# Patient Record
Sex: Female | Born: 1935 | Race: Black or African American | Hispanic: No
Health system: Southern US, Community
[De-identification: ages and names within clinical notes are randomized; demographics above are authoritative.]

---

## 2019-04-12 ENCOUNTER — Encounter (HOSPITAL_COMMUNITY): Payer: Self-pay | Admitting: Emergency Medicine

## 2019-04-12 ENCOUNTER — Emergency Department (HOSPITAL_COMMUNITY): Payer: Medicare Other

## 2019-04-12 ENCOUNTER — Other Ambulatory Visit: Payer: Self-pay

## 2019-04-12 ENCOUNTER — Emergency Department (HOSPITAL_COMMUNITY)
Admission: EM | Admit: 2019-04-12 | Discharge: 2019-04-12 | Disposition: A | Discharge status: Home / Self Care | Payer: Medicare Other | Attending: Emergency Medicine | Admitting: Emergency Medicine

## 2019-04-12 DIAGNOSIS — R7309 Other abnormal glucose: Secondary | ICD-10-CM | POA: Diagnosis not present

## 2019-04-12 DIAGNOSIS — I160 Hypertensive urgency: Secondary | ICD-10-CM | POA: Diagnosis not present

## 2019-04-12 DIAGNOSIS — R4781 Slurred speech: Secondary | ICD-10-CM | POA: Diagnosis not present

## 2019-04-12 LAB — CBC
HCT: 49.2 % — ABNORMAL HIGH (ref 36.0–46.0)
Hemoglobin: 15.7 g/dL — ABNORMAL HIGH (ref 12.0–15.0)
MCH: 28.7 pg (ref 26.0–34.0)
MCHC: 31.9 g/dL (ref 30.0–36.0)
MCV: 89.9 fL (ref 80.0–100.0)
Platelets: 223 10*3/uL (ref 150–400)
RBC: 5.47 MIL/uL — ABNORMAL HIGH (ref 3.87–5.11)
RDW: 16.4 % — ABNORMAL HIGH (ref 11.5–15.5)
WBC: 4.1 10*3/uL (ref 4.0–10.5)
nRBC: 0 % (ref 0.0–0.2)

## 2019-04-12 LAB — COMPREHENSIVE METABOLIC PANEL
ALT: 12 U/L (ref 0–44)
AST: 19 U/L (ref 15–41)
Albumin: 3.3 g/dL — ABNORMAL LOW (ref 3.5–5.0)
Alkaline Phosphatase: 112 U/L (ref 38–126)
Anion gap: 14 (ref 5–15)
BUN: 21 mg/dL (ref 8–23)
CO2: 21 mmol/L — ABNORMAL LOW (ref 22–32)
Calcium: 10.5 mg/dL — ABNORMAL HIGH (ref 8.9–10.3)
Chloride: 106 mmol/L (ref 98–111)
Creatinine, Ser: 1.57 mg/dL — ABNORMAL HIGH (ref 0.44–1.00)
GFR calc Af Amer: 35 mL/min — ABNORMAL LOW (ref >60)
GFR calc non Af Amer: 30 mL/min — ABNORMAL LOW (ref >60)
Glucose, Bld: 155 mg/dL — ABNORMAL HIGH (ref 70–99)
Potassium: 3.8 mmol/L (ref 3.5–5.1)
Sodium: 141 mmol/L (ref 135–145)
Total Bilirubin: 0.7 mg/dL (ref 0.3–1.2)
Total Protein: 8.8 g/dL — ABNORMAL HIGH (ref 6.5–8.1)

## 2019-04-12 LAB — I-STAT CHEM 8, ED
BUN: 25 mg/dL — ABNORMAL HIGH (ref 8–23)
Calcium, Ion: 1.19 mmol/L (ref 1.15–1.40)
Chloride: 107 mmol/L (ref 98–111)
Creatinine, Ser: 1.5 mg/dL — ABNORMAL HIGH (ref 0.44–1.00)
Glucose, Bld: 156 mg/dL — ABNORMAL HIGH (ref 70–99)
HCT: 51 % — ABNORMAL HIGH (ref 36.0–46.0)
Hemoglobin: 17.3 g/dL — ABNORMAL HIGH (ref 12.0–15.0)
Potassium: 3.9 mmol/L (ref 3.5–5.1)
Sodium: 142 mmol/L (ref 135–145)
TCO2: 26 mmol/L (ref 22–32)

## 2019-04-12 LAB — DIFFERENTIAL
Abs Immature Granulocytes: 0.01 10*3/uL (ref 0.00–0.07)
Basophils Absolute: 0 10*3/uL (ref 0.0–0.1)
Basophils Relative: 1 %
Eosinophils Absolute: 0.2 10*3/uL (ref 0.0–0.5)
Eosinophils Relative: 4 %
Immature Granulocytes: 0 %
Lymphocytes Relative: 31 %
Lymphs Abs: 1.3 10*3/uL (ref 0.7–4.0)
Monocytes Absolute: 0.3 10*3/uL (ref 0.1–1.0)
Monocytes Relative: 7 %
Neutro Abs: 2.4 10*3/uL (ref 1.7–7.7)
Neutrophils Relative %: 57 %

## 2019-04-12 LAB — PROTIME-INR
INR: 1.1 (ref 0.8–1.2)
Prothrombin Time: 13.6 seconds (ref 11.4–15.2)

## 2019-04-12 LAB — CBG MONITORING, ED: Glucose-Capillary: 154 mg/dL — ABNORMAL HIGH (ref 70–99)

## 2019-04-12 LAB — APTT: aPTT: 30 seconds (ref 24–36)

## 2019-04-12 MED ORDER — IOHEXOL 350 MG/ML SOLN
100.0000 mL | Freq: Once | INTRAVENOUS | Status: AC | PRN
  Administered 2019-04-12: 100 mL via INTRAVENOUS

## 2019-04-12 MED ORDER — SODIUM CHLORIDE 0.9% FLUSH
3.0000 mL | Freq: Once | INTRAVENOUS | Status: DC
Start: 2019-04-12 — End: 2019-04-12

## 2019-04-12 NOTE — Consult Note (Signed)
Neurology Consultation  Reason for Consult: code stroke, confusion and speech changes Referring Physician: Dr Charm Barges  CC: Sudden onset of confusion and speech changes.  History is obtained from: Family over the phone  HPI: Natasha Salazar is a 86 y.o. female past medical history of hypertension, diabetes, atrial fibrillation, prior history of breast cancer, and moderate dementia, visiting here for a family member's funeral from [place]. Patient was in her usual state of health till about 10:00 when she reached to the funeral site for her younger sister who had died a few days ago.  According to the family, the patient has dementia and was receiving sympathy cards for her sister's demise but was not really processing the information and understanding that the sister died until she came to the viewing, starting the cascade and suddenly became disoriented and confabulated. Her speech made no sense.  She appeared very different than her usual self.  EMS was called and they found her to be slightly dysphasic-   LKW: 1000 today tpa given?: no, Xarelto Premorbid modified Rankin scale (mRS):3  ZOX:[id number] to obtain due to altered mental status.   No past medical history on file. HTN HLD DM Breast Ca Dementia  No family history on file. No h/o stroke  Social History:   has no history on file for tobacco, alcohol, and drug.  Medications  Current Facility-Administered Medications:  .  sodium chloride flush (NS) 0.9 % injection 3 mL, 3 mL, Intravenous, Once, Terrilee Files, MD  Current Outpatient Medications:  .  allopurinol (ZYLOPRIM) 300 MG tablet, Take 150 mg by mouth at bedtime., Disp: , Rfl:  .  amLODipine (NORVASC) 10 MG tablet, Take 10 mg by mouth daily., Disp: , Rfl:  .  ascorbic acid (VITAMIN C) 500 MG tablet, Take 500 mg by mouth daily., Disp: , Rfl:  .  aspirin 81 MG EC tablet, Take 1 tablet by mouth daily., Disp: , Rfl:  .  atorvastatin (LIPITOR) 40 MG tablet, Take 40 mg by  mouth daily., Disp: , Rfl:  .  donepezil (ARICEPT) 5 MG tablet, Take 5 mg by mouth at bedtime., Disp: , Rfl:  .  furosemide (LASIX) 40 MG tablet, Take 40 mg by mouth daily as needed for edema., Disp: , Rfl:  .  isosorbide mononitrate (IMDUR) 60 MG 24 hr tablet, Take 60 mg by mouth every morning., Disp: , Rfl:  .  losartan (COZAAR) 100 MG tablet, Take 100 mg by mouth daily., Disp: , Rfl:  .  metoprolol tartrate (LOPRESSOR) 25 MG tablet, Take 12.5 mg by mouth in the morning and at bedtime., Disp: , Rfl:  .  omeprazole (PRILOSEC) 40 MG capsule, Take 1 capsule by mouth at bedtime., Disp: , Rfl:  .  POLY-IRON 150 150 MG capsule, Take 150 mg by mouth daily., Disp: , Rfl:  .  TRADJENTA 5 MG TABS tablet, Take 5 mg by mouth daily., Disp: , Rfl:  .  XARELTO 15 MG TABS tablet, Take 15 mg by mouth at bedtime., Disp: , Rfl:   Exam: Current vital signs: BP (!) 164/84   Pulse 76   Temp (!) 97.4 F (36.3 C) (Oral)   Resp 14   Wt 93.7 kg   SpO2 98%  Vital signs in last 24 hours: Temp:  [97.4 F (36.3 C)] 97.4 F (36.3 C) (02/27 1227) Pulse Rate:  [71-76] 76 (02/27 1245) Resp:  [14-16] 14 (02/27 1400) BP: (140-164)/(84-106) 164/84 (02/27 1400) SpO2:  [94 %-99 %] 98 % (02/27  1245) Weight:  [93.7 kg] 93.7 kg (02/27 1221) General: Awake alert in no distress HEENT: Normocephalic atraumatic CVs: Irregularly irregular Respiratory: Breathing normally saturating well Neurological exam She is awake, alert, appears a little anxious and is staring around with somewhat of a confused look.  During the course of the encounter, she is more verbal and starts to respond more appropriately. Upon asking age said she is 86 years old.  Could not tell me the month. Naming, comprehension and repetition is intact albeit slow. Poor attention concentration She has mild to moderate dysarthria. Cranial: Pupils equal round react light, extraocular movements intact, visual fields are full, face appears symmetric, tongue and  palate midline. Motor exam: Antigravity 5/5 on both upper extremities.  Both lower extremities are symmetrically weak and are barely antigravity-some of it is effort dependent exam. Sensory exam: Intact to touch all over Coordination: No dysmetria on finger-nose-finger testing NIH stroke scale 1a Level of Conscious.: 0 1b LOC Questions: 2 1c LOC Commands: 0 2 Best Gaze: 0 3 Visual: 0 4 Facial Palsy: 0 5a Motor Arm - left: 0 5b Motor Arm - Right: 0 6a Motor Leg - Left: 2 6b Motor Leg - Right: 2 7 Limb Ataxia: 0 8 Sensory: 0 9 Best Language: 0 10 Dysarthria: 1 11 Extinct. and Inatten.: 0 TOTAL: 7   Labs I have reviewed labs in epic and the results pertinent to this consultation are:  CBC    Component Value Date/Time   WBC 4.1 04/12/2019 1153   RBC 5.47 (H) 04/12/2019 1153   HGB 15.7 (H) 04/12/2019 1153   HCT 49.2 (H) 04/12/2019 1153   PLT 223 04/12/2019 1153   MCV 89.9 04/12/2019 1153   MCH 28.7 04/12/2019 1153   MCHC 31.9 04/12/2019 1153   RDW 16.4 (H) 04/12/2019 1153   LYMPHSABS 1.3 04/12/2019 1153   MONOABS 0.3 04/12/2019 1153   EOSABS 0.2 04/12/2019 1153   BASOSABS 0.0 04/12/2019 1153    CMP     Component Value Date/Time   NA 141 04/12/2019 1153   K 3.8 04/12/2019 1153   CL 106 04/12/2019 1153   CO2 21 (L) 04/12/2019 1153   GLUCOSE 155 (H) 04/12/2019 1153   BUN 21 04/12/2019 1153   CREATININE 1.57 (H) 04/12/2019 1153   CALCIUM 10.5 (H) 04/12/2019 1153   PROT 8.8 (H) 04/12/2019 1153   ALBUMIN 3.3 (L) 04/12/2019 1153   AST 19 04/12/2019 1153   ALT 12 04/12/2019 1153   ALKPHOS 112 04/12/2019 1153   BILITOT 0.7 04/12/2019 1153   GFRNONAA 30 (L) 04/12/2019 1153   GFRAA 35 (L) 04/12/2019 1153    Imaging I have reviewed the images obtained:  CT-scan of the brain-severe chronic white matter disease.  Moderate atrophy.  No acute changes. CTA head and neck with no LVO.  Multiple intracerebral calcifications in anterior and posterior circulation.  CT  perfusion study unremarkable  Assessment: 86 year old past history of hypertension diabetes atrial fibrillation dementia presenting for evaluation of acute onset of confusion and speech difficulty when she was attending a funeral of her younger sibling. According to family, she had a very animated stress reaction followed by garbled speech.  Also noted to be hypersensitive in the systolic [id number]. Imaging unremarkable for stroke process. Given the history of being on Xarelto for atrial fibrillation, stat head CT was done to rule out a bleed-CT head was negative. Symptoms likely consistent with hypertensive emergency versus acute stress reaction.  Recommendations: Supportive treatment per the emergency room Blood  pressure-gradual reduction by 20% with ultimate goal of blood pressure less than 140/90 Continue home anticoagulation and antihypertensives. Management of dementia per outpatient neurology locally whenever she gets her care. Neurology will be available as needed -- Milon Dikes, MD [facility] Pager: [phone] If 7pm to 7am, please call on call as listed on [facility].  CRITICAL CARE ATTESTATION Performed by: Milon Dikes, MD Total critical care time: 40 minutes Critical care time was exclusive of separately billable procedures and treating other patients and/or supervising APPs/Residents/Students Critical care was necessary to treat or prevent imminent or life-threatening deterioration due to stroke like symptoms, hypertensive urgency  This patient is critically ill and at significant risk for neurological worsening and/or death and care requires constant monitoring. Critical care was time spent personally by me on the following activities: development of treatment plan with patient and/or surrogate as well as nursing, discussions with consultants, evaluation of patient's response to treatment, examination of patient, obtaining history from patient or surrogate, ordering and  performing treatments and interventions, ordering and review of laboratory studies, ordering and review of radiographic studies, pulse oximetry, re-evaluation of patient's condition, participation in multidisciplinary rounds and medical decision making of high complexity in the care of this patient.

## 2019-04-12 NOTE — ED Triage Notes (Signed)
Pt arrives [facility] as a code stroke. Pt was at her sisters viewing when family noticed a sudden change in pt speech. Pt with hx of afib and dementia. + dysphagia per ems. 18g LAC.  HR 70 BP 202/108 94% RA

## 2019-04-12 NOTE — Discharge Instructions (Addendum)
You were seen in the emergency department for an episode of garbled speech and elevated blood pressure.  You had blood work and CAT scans that did not show any obvious sign of stroke.  Your blood pressure improved with time here.  This is possibly related to a very stressful day.  Please continue your regular medications and follow-up with your doctor.  Return to the emergency department if any worsening or concerning symptoms.

## 2019-04-12 NOTE — ED Provider Notes (Signed)
[facility] EMERGENCY DEPARTMENT Provider Note   CSN: [id number] Arrival date & time: 04/12/19  1145     History Chief Complaint  Patient presents with  . Code Stroke    Natasha Salazar is a 86 y.o. female.  She is brought in by EMS for acute onset of garbled speech.  She was at her deceased sisters viewing when she acutely had a change in her behavior and some garbled speech.  No reported numbness or weakness.  Code stroke activation in the field.  Hypertensive here.  Reportedly on blood thinners.  The history is provided by the patient and the EMS personnel.  Cerebrovascular Accident This is a new problem. The current episode started less than 1 hour ago. The problem has not changed since onset.Associated symptoms include headaches. Pertinent negatives include no chest pain, no abdominal pain and no shortness of breath. Nothing aggravates the symptoms. Nothing relieves the symptoms. She has tried nothing for the symptoms. The treatment provided no relief.       No past medical history on file.  There are no problems to display for this patient.   History reviewed. No pertinent surgical history.   OB History   No obstetric history on file.     No family history on file.  Social History   Tobacco Use  . Smoking status: Not on file  Substance Use Topics  . Alcohol use: Not on file  . Drug use: Not on file    Home Medications Prior to Admission medications   Not on File    Allergies    Patient has no allergy information on record.  Review of Systems   Review of Systems  Constitutional: Negative for fever.  HENT: Negative for sore throat.   Eyes: Negative for visual disturbance.  Respiratory: Negative for shortness of breath.   Cardiovascular: Negative for chest pain.  Gastrointestinal: Negative for abdominal pain.  Genitourinary: Negative for dysuria.  Musculoskeletal: Negative for neck pain.  Skin: Negative for rash.  Neurological: Positive  for headaches.    Physical Exam Updated Vital Signs BP (!) 164/84   Pulse 76   Temp (!) 97.4 F (36.3 C) (Oral)   Resp 14   Wt 93.7 kg   SpO2 98%   Physical Exam Vitals and nursing note reviewed.  Constitutional:      General: She is not in acute distress.    Appearance: She is well-developed.  HENT:     Head: Normocephalic and atraumatic.  Eyes:     Conjunctiva/sclera: Conjunctivae normal.  Cardiovascular:     Rate and Rhythm: Normal rate and regular rhythm.     Heart sounds: No murmur.  Pulmonary:     Effort: Pulmonary effort is normal. No respiratory distress.     Breath sounds: Normal breath sounds.  Abdominal:     Palpations: Abdomen is soft.     Tenderness: There is no abdominal tenderness.  Musculoskeletal:        General: No deformity or signs of injury. Normal range of motion.     Cervical back: Neck supple.  Skin:    General: Skin is warm and dry.     Capillary Refill: Capillary refill takes less than 2 seconds.  Neurological:     General: No focal deficit present.     Mental Status: She is alert. Mental status is at baseline.     ED Results / Procedures / Treatments   Labs (all labs ordered are listed, but only abnormal  results are displayed) Labs Reviewed  CBC - Abnormal; Notable for the following components:      Result Value   RBC 5.47 (*)    Hemoglobin 15.7 (*)    HCT 49.2 (*)    RDW 16.4 (*)    All other components within normal limits  COMPREHENSIVE METABOLIC PANEL - Abnormal; Notable for the following components:   CO2 21 (*)    Glucose, Bld 155 (*)    Creatinine, Ser 1.57 (*)    Calcium 10.5 (*)    Total Protein 8.8 (*)    Albumin 3.3 (*)    GFR calc non Af Amer 30 (*)    GFR calc Af Amer 35 (*)    All other components within normal limits  I-STAT CHEM 8, ED - Abnormal; Notable for the following components:   BUN 25 (*)    Creatinine, Ser 1.50 (*)    Glucose, Bld 156 (*)    Hemoglobin 17.3 (*)    HCT 51.0 (*)    All other  components within normal limits  CBG MONITORING, ED - Abnormal; Notable for the following components:   Glucose-Capillary 154 (*)    All other components within normal limits  PROTIME-INR  APTT  DIFFERENTIAL    EKG None  Radiology CT Code Stroke CTA Head W/WO contrast  Result Date: 04/12/2019 CLINICAL DATA:  Aphasia EXAM: CT ANGIOGRAPHY HEAD AND NECK CT PERFUSION BRAIN TECHNIQUE: Multidetector CT imaging of the head and neck was performed using the standard protocol during bolus administration of intravenous contrast. Multiplanar CT image reconstructions and MIPs were obtained to evaluate the vascular anatomy. Carotid stenosis measurements (when applicable) are obtained utilizing NASCET criteria, using the distal internal carotid diameter as the denominator. Multiphase CT imaging of the brain was performed following IV bolus contrast injection. Subsequent parametric perfusion maps were calculated using RAPID software. CONTRAST:  OMNIPAQUE IOHEXOL 350 MG/ML SOLN COMPARISON:  CT head earlier today FINDINGS: CTA NECK FINDINGS Aortic arch: Standard branching. Imaged portion shows no evidence of aneurysm or dissection. No significant stenosis of the major arch vessel origins. Atherosclerotic calcification aortic arch and proximal great vessels. Right carotid system: Atherosclerotic calcification right carotid bifurcation without significant stenosis. Tortuosity right internal carotid artery. Left carotid system: Atherosclerotic calcification left carotid bifurcation. Less than 25% diameter stenosis proximal left internal carotid artery which is tortuous. Vertebral arteries: Left vertebral artery dominant. Mild stenosis proximal and distal left vertebral artery. Small right vertebral artery is patent to the basilar without significant stenosis. Skeleton: Cervical spondylosis.  No acute skeletal abnormality. Other neck: Diffuse enlargement of the thyroid compatible with goiter. Multiple small nodules.  No dominant mass. Trachea deviated to the left. Upper chest: Lung apices clear bilaterally. Review of the MIP images confirms the above findings CTA HEAD FINDINGS Anterior circulation: Extensive atherosclerotic calcification in the cavernous carotid bilaterally with mild to moderate stenosis bilaterally. Anterior and middle cerebral arteries patent bilaterally without stenosis or large vessel occlusion. Posterior circulation: Mild calcific stenosis distal left vertebral artery. PICA patent bilaterally. Basilar widely patent. AICA, superior cerebellar, posterior cerebral arteries patent bilaterally. Fetal origin posterior cerebral artery bilaterally. Venous sinuses: Minimal venous contrast due to arterial phase scanning Anatomic variants: None Review of the MIP images confirms the above findings CT Brain Perfusion Findings: ASPECTS: 10 CBF (<30%) Volume: 43mL Perfusion (Tmax>6.0s) volume: 55mL Mismatch Volume: 32mL Infarction Location:None IMPRESSION: 1. CT perfusion negative for acute infarct or ischemia 2. Negative for intracranial large vessel occlusion 3. Atherosclerotic disease of the  carotid bifurcation and in the cavernous carotid bilaterally without flow limiting stenosis. 4. Left vertebral dominant with mild stenosis proximally and distally. Small right vertebral artery without significant stenosis. 5. Diffuse thyroid enlargement with goiter change. Electronically Signed   By: Franchot Gallo M.D.   On: 04/12/2019 12:31   CT Code Stroke CTA Neck W/WO contrast  Result Date: 04/12/2019 CLINICAL DATA:  Aphasia EXAM: CT ANGIOGRAPHY HEAD AND NECK CT PERFUSION BRAIN TECHNIQUE: Multidetector CT imaging of the head and neck was performed using the standard protocol during bolus administration of intravenous contrast. Multiplanar CT image reconstructions and MIPs were obtained to evaluate the vascular anatomy. Carotid stenosis measurements (when applicable) are obtained utilizing NASCET criteria, using the distal  internal carotid diameter as the denominator. Multiphase CT imaging of the brain was performed following IV bolus contrast injection. Subsequent parametric perfusion maps were calculated using RAPID software. CONTRAST:  117mL OMNIPAQUE IOHEXOL 350 MG/ML SOLN COMPARISON:  CT head earlier today FINDINGS: CTA NECK FINDINGS Aortic arch: Standard branching. Imaged portion shows no evidence of aneurysm or dissection. No significant stenosis of the major arch vessel origins. Atherosclerotic calcification aortic arch and proximal great vessels. Right carotid system: Atherosclerotic calcification right carotid bifurcation without significant stenosis. Tortuosity right internal carotid artery. Left carotid system: Atherosclerotic calcification left carotid bifurcation. Less than 25% diameter stenosis proximal left internal carotid artery which is tortuous. Vertebral arteries: Left vertebral artery dominant. Mild stenosis proximal and distal left vertebral artery. Small right vertebral artery is patent to the basilar without significant stenosis. Skeleton: Cervical spondylosis.  No acute skeletal abnormality. Other neck: Diffuse enlargement of the thyroid compatible with goiter. Multiple small nodules. No dominant mass. Trachea deviated to the left. Upper chest: Lung apices clear bilaterally. Review of the MIP images confirms the above findings CTA HEAD FINDINGS Anterior circulation: Extensive atherosclerotic calcification in the cavernous carotid bilaterally with mild to moderate stenosis bilaterally. Anterior and middle cerebral arteries patent bilaterally without stenosis or large vessel occlusion. Posterior circulation: Mild calcific stenosis distal left vertebral artery. PICA patent bilaterally. Basilar widely patent. AICA, superior cerebellar, posterior cerebral arteries patent bilaterally. Fetal origin posterior cerebral artery bilaterally. Venous sinuses: Minimal venous contrast due to arterial phase scanning Anatomic  variants: None Review of the MIP images confirms the above findings CT Brain Perfusion Findings: ASPECTS: 10 CBF (<30%) Volume: 59mL Perfusion (Tmax>6.0s) volume: 24mL Mismatch Volume: 53mL Infarction Location:None IMPRESSION: 1. CT perfusion negative for acute infarct or ischemia 2. Negative for intracranial large vessel occlusion 3. Atherosclerotic disease of the carotid bifurcation and in the cavernous carotid bilaterally without flow limiting stenosis. 4. Left vertebral dominant with mild stenosis proximally and distally. Small right vertebral artery without significant stenosis. 5. Diffuse thyroid enlargement with goiter change. Electronically Signed   By: Franchot Gallo M.D.   On: 04/12/2019 12:31   CT Code Stroke Cerebral Perfusion with contrast  Result Date: 04/12/2019 CLINICAL DATA:  Aphasia EXAM: CT ANGIOGRAPHY HEAD AND NECK CT PERFUSION BRAIN TECHNIQUE: Multidetector CT imaging of the head and neck was performed using the standard protocol during bolus administration of intravenous contrast. Multiplanar CT image reconstructions and MIPs were obtained to evaluate the vascular anatomy. Carotid stenosis measurements (when applicable) are obtained utilizing NASCET criteria, using the distal internal carotid diameter as the denominator. Multiphase CT imaging of the brain was performed following IV bolus contrast injection. Subsequent parametric perfusion maps were calculated using RAPID software. CONTRAST:  130mL OMNIPAQUE IOHEXOL 350 MG/ML SOLN COMPARISON:  CT head earlier today FINDINGS: CTA NECK  FINDINGS Aortic arch: Standard branching. Imaged portion shows no evidence of aneurysm or dissection. No significant stenosis of the major arch vessel origins. Atherosclerotic calcification aortic arch and proximal great vessels. Right carotid system: Atherosclerotic calcification right carotid bifurcation without significant stenosis. Tortuosity right internal carotid artery. Left carotid system: Atherosclerotic  calcification left carotid bifurcation. Less than 25% diameter stenosis proximal left internal carotid artery which is tortuous. Vertebral arteries: Left vertebral artery dominant. Mild stenosis proximal and distal left vertebral artery. Small right vertebral artery is patent to the basilar without significant stenosis. Skeleton: Cervical spondylosis.  No acute skeletal abnormality. Other neck: Diffuse enlargement of the thyroid compatible with goiter. Multiple small nodules. No dominant mass. Trachea deviated to the left. Upper chest: Lung apices clear bilaterally. Review of the MIP images confirms the above findings CTA HEAD FINDINGS Anterior circulation: Extensive atherosclerotic calcification in the cavernous carotid bilaterally with mild to moderate stenosis bilaterally. Anterior and middle cerebral arteries patent bilaterally without stenosis or large vessel occlusion. Posterior circulation: Mild calcific stenosis distal left vertebral artery. PICA patent bilaterally. Basilar widely patent. AICA, superior cerebellar, posterior cerebral arteries patent bilaterally. Fetal origin posterior cerebral artery bilaterally. Venous sinuses: Minimal venous contrast due to arterial phase scanning Anatomic variants: None Review of the MIP images confirms the above findings CT Brain Perfusion Findings: ASPECTS: 10 CBF (<30%) Volume: 71mL Perfusion (Tmax>6.0s) volume: 79mL Mismatch Volume: 3mL Infarction Location:None IMPRESSION: 1. CT perfusion negative for acute infarct or ischemia 2. Negative for intracranial large vessel occlusion 3. Atherosclerotic disease of the carotid bifurcation and in the cavernous carotid bilaterally without flow limiting stenosis. 4. Left vertebral dominant with mild stenosis proximally and distally. Small right vertebral artery without significant stenosis. 5. Diffuse thyroid enlargement with goiter change. Electronically Signed   By: Marlan Palau M.D.   On: 04/12/2019 12:31   CT HEAD CODE  STROKE WO CONTRAST  Result Date: 04/12/2019 CLINICAL DATA:  Code stroke.  Acute neuro deficit.  Rule out stroke EXAM: CT HEAD WITHOUT CONTRAST TECHNIQUE: Contiguous axial images were obtained from the base of the skull through the vertex without intravenous contrast. COMPARISON:  None. FINDINGS: Brain: Moderate atrophy. Extensive chronic microvascular ischemic changes throughout the white matter. Chronic infarct left basal ganglia. Small chronic infarcts in the cerebellum bilaterally. Negative for acute infarct, hemorrhage, mass. Vascular: Extensive atherosclerotic calcification carotid and vertebral arteries. Negative for hyperdense vessel Skull: Negative Sinuses/Orbits: Mild mucosal edema paranasal sinuses. Bilateral cataract extraction. Other: None ASPECTS (Alberta Stroke Program Early CT Score) - Ganglionic level infarction (caudate, lentiform nuclei, internal capsule, insula, M1-M3 cortex): 7 - Supraganglionic infarction (M4-M6 cortex): 3 Total score (0-10 with 10 being normal): 10 IMPRESSION: 1. No acute abnormality 2. Advanced atrophy and extensive chronic ischemic changes. 3. ASPECTS is 10 4. These results were called by telephone at the time of interpretation on 04/12/2019 at 12:06 pm to provider Wilford Corner , who verbally acknowledged these results. Electronically Signed   By: Marlan Palau M.D.   On: 04/12/2019 12:06    Procedures Procedures (including critical care time)  Medications Ordered in ED Medications  sodium chloride flush (NS) 0.9 % injection 3 mL (has no administration in time range)  iohexol (OMNIPAQUE) 350 MG/ML injection 100 mL (100 mLs Intravenous Contrast Given 04/12/19 1220)    ED Course  I have reviewed the triage vital signs and the nursing notes.  Pertinent labs & imaging results that were available during my care of the patient were reviewed by me and considered in my medical decision  making (see chart for details).  Clinical Course as of Apr 11 1638  Sat Apr 12, 2019   1208 Differential includes stroke, intracerebral bleed, hypertensive emergency, grief reaction   [MB]  1210 Initial head CT negative for bleed.   [MB]  1230 Patient's daughter here is now able to give a little more history.  They are visiting from [place] and patient's sister had passed away about a week ago.  Today there was a viewing which caused the patient to start crying and have trouble breathing.  Patient is back at her baseline.  Has dementia and poor short-term memory.  Patient denies any complaints currently.  She has no recollection of the event that happened earlier.   [MB]  1243 CT imaging showing microvascular changes but no acute bleed or infarct.  Neurology feels this may be more behavioral than structural.   [MB]  1255 EKG showing A. fib rate of 91 PVC nonspecific ST-T's.  No old 1 to compare with.   [MB]  1302 Daughter states patient has not taken her blood pressure medicine this morning.  Likely why her BP is high. Will let her take her regular medications.    [MB]  1418 Patient took her home meds and her blood pressure is somewhat improved.  I do not have any old labs to compare with but she has some elevation of her creatinine to 1.57.  Rest of her labs look fairly benign.   [MB]  1418 Daughter states she is at her baseline and is comfortable taking her home.   [MB]    Clinical Course User Index [MB] Terrilee Files, MD   MDM Rules/Calculators/A&P                       Final Clinical Impression(s) / ED Diagnoses Final diagnoses:  Slurred speech  Hypertensive urgency    Rx / DC Orders ED Discharge Orders    None       Terrilee Files, MD 04/12/19 [phone]

## 2019-04-12 NOTE — ED Notes (Signed)
Per MD Charm Barges, pt taking home BP medications.

## 2019-04-12 NOTE — ED Notes (Signed)
Called [facility] to cancel code stroke  

## 2019-04-12 NOTE — ED Notes (Signed)
Patient Alert and oriented to baseline. Stable and ambulatory to baseline. Patient verbalized understanding of the discharge instructions.  Patient belongings were taken by the patient.   

## 2020-08-13 DEATH — deceased

## 2021-01-29 IMAGING — CT CT HEAD CODE STROKE
3 series · 15 of 47 positions shown, 18 images · non-contrast
Comparison: None.

CLINICAL DATA: Code stroke.  Acute neuro deficit.  Rule out stroke

EXAM:
CT HEAD WITHOUT CONTRAST
TECHNIQUE: Contiguous axial images were obtained from the base of the skull
through the vertex without intravenous contrast.

[Series 3: head 5.0 st · axial · 0.44mm/px · z∈[-146,-6]mm · 9 of 34 slices shown, 12 images]
[im 3/34  brain]
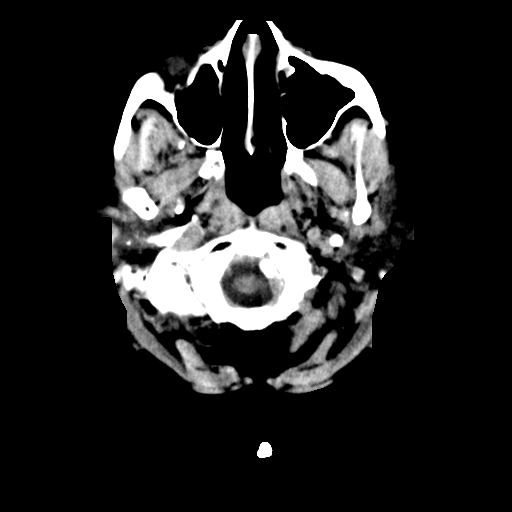
[im 3/34  bone]
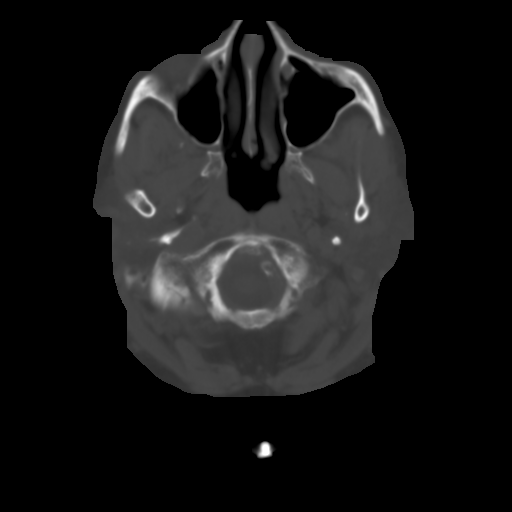
[im 6/34  brain]
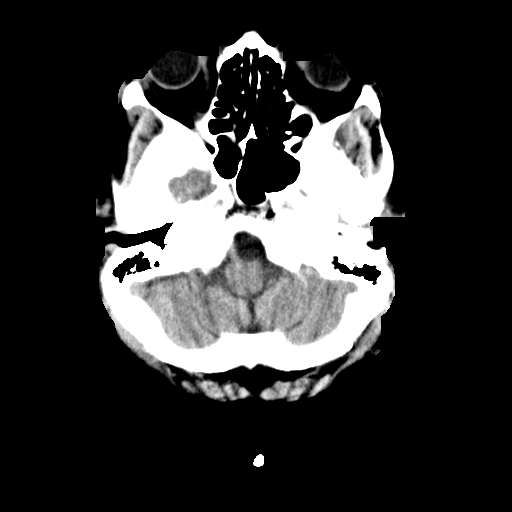
[im 10/34  brain]
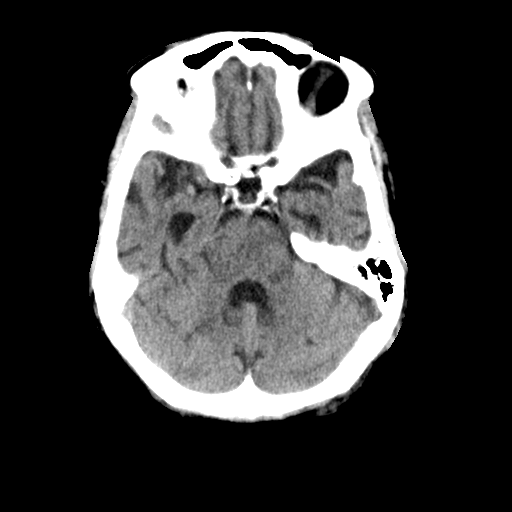
[im 13/34  brain]
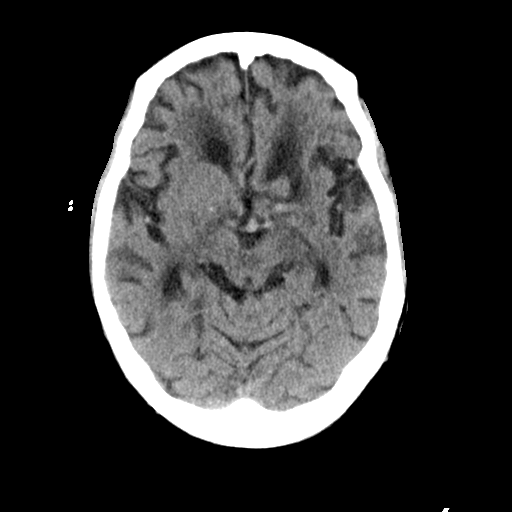
[im 18/34  brain]
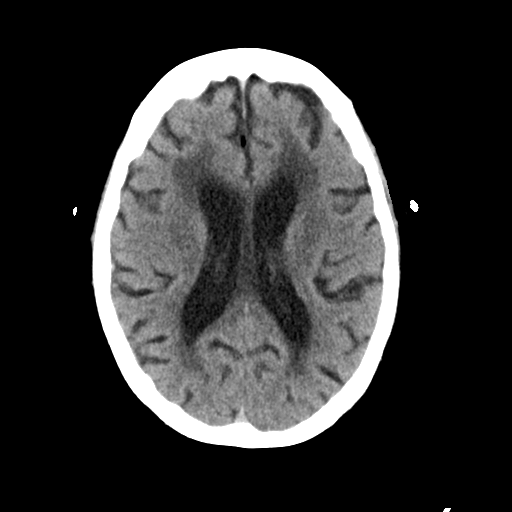
[im 18/34  bone]
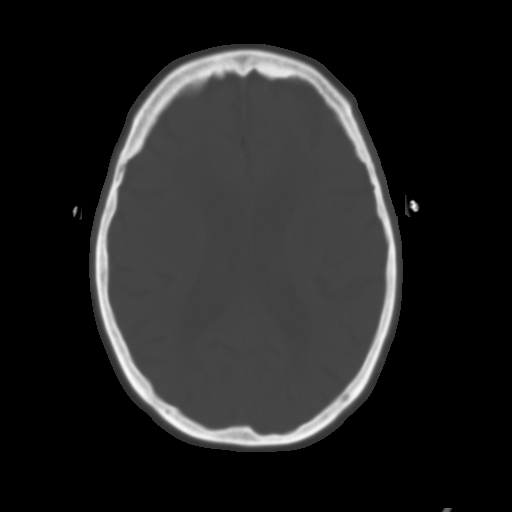
[im 21/34  brain]
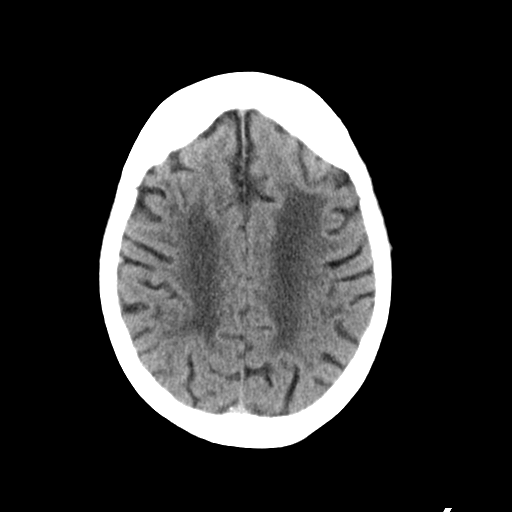
[im 24/34  brain]
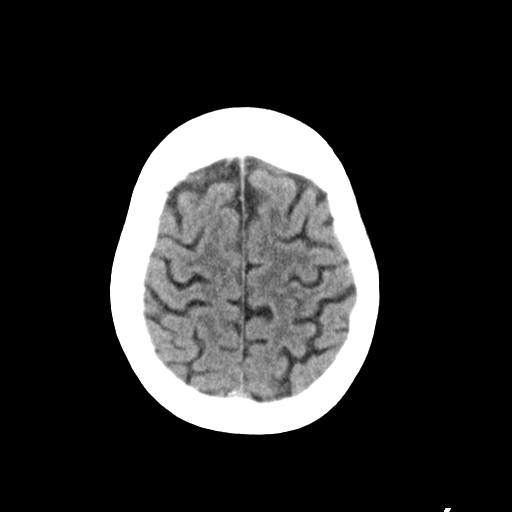
[im 28/34  brain]
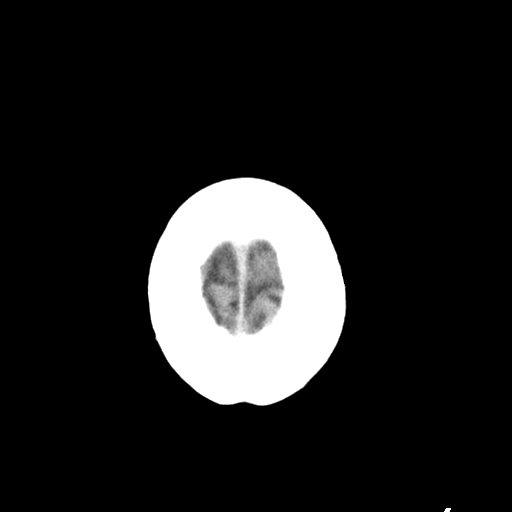
[im 31/34  brain]
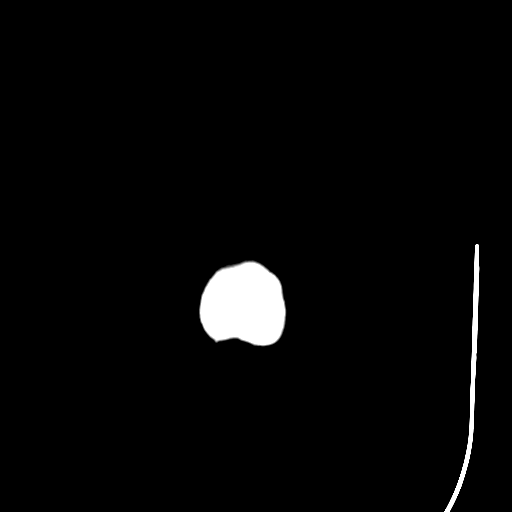
[im 31/34  bone]
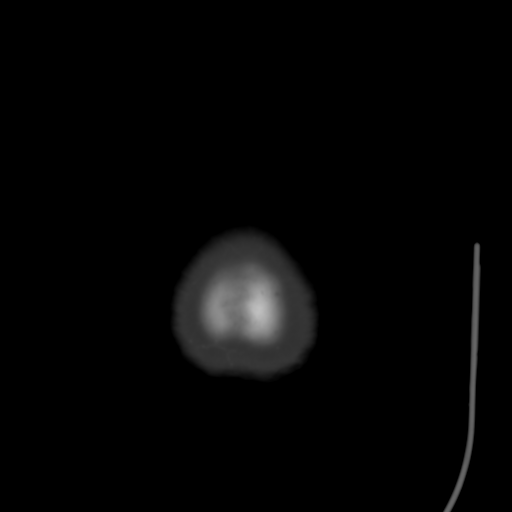

[Series 5: head 3.0 cor st · coronal · 0.31mm/px · 3 of 67 slices shown]
[im 23/67  brain]
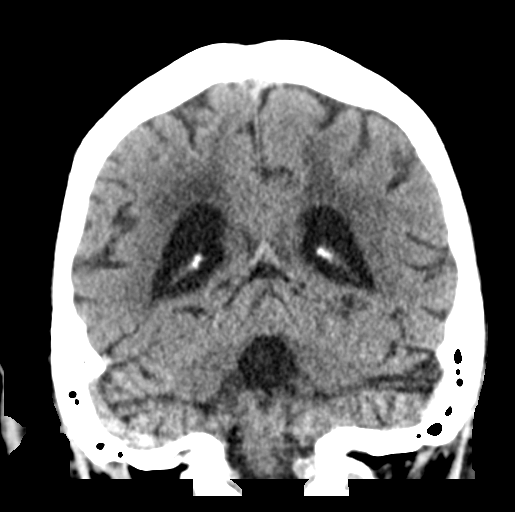
[im 30/67  brain]
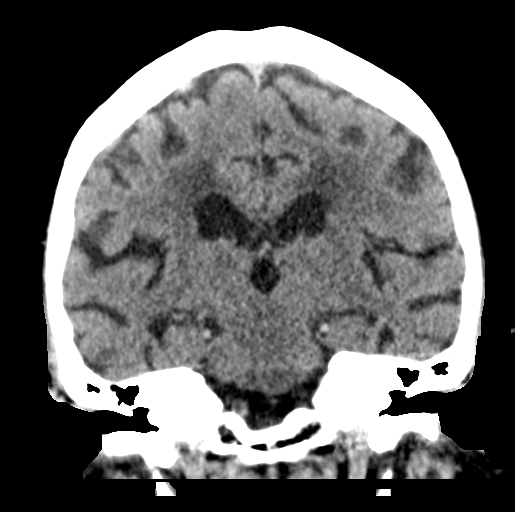
[im 37/67  brain]
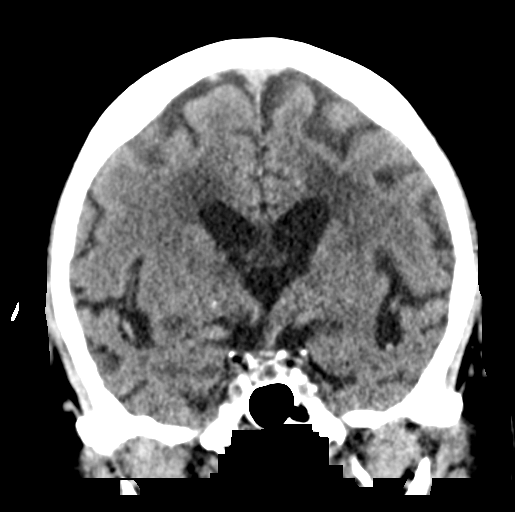

[Series 6: head 3.0 sag st · sagittal · 0.31mm/px · 3 of 63 slices shown]
[im 21/63  brain]
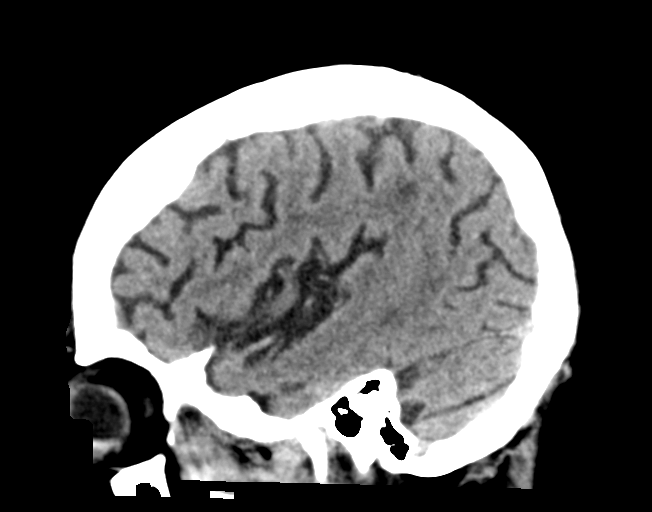
[im 32/63  brain]
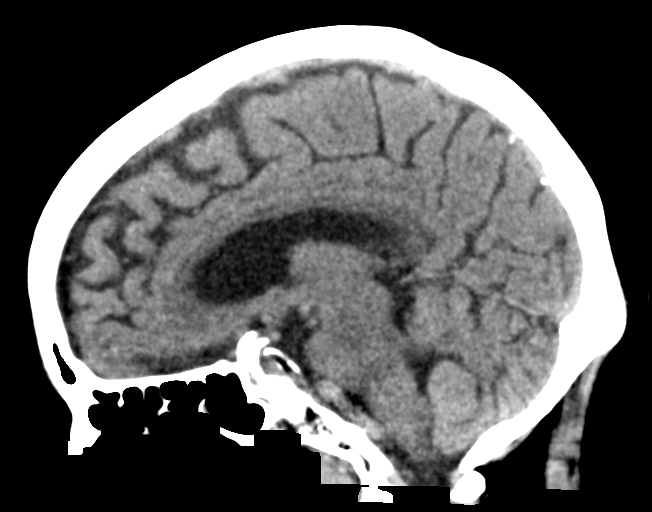
[im 42/63  brain]
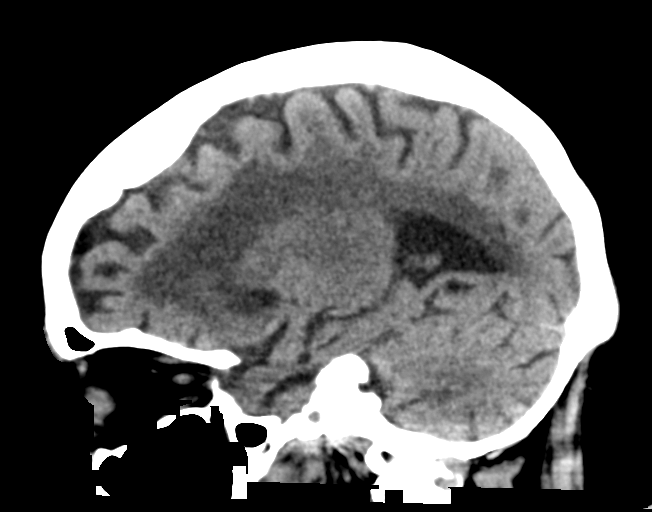

[15 of 47 positions shown; findings below may reference images not displayed]

FINDINGS: Brain: Moderate atrophy. Extensive chronic microvascular ischemic
changes throughout the white matter. Chronic infarct left basal
ganglia. Small chronic infarcts in the cerebellum bilaterally.

Negative for acute infarct, hemorrhage, mass.

Vascular: Extensive atherosclerotic calcification carotid and
vertebral arteries. Negative for hyperdense vessel

Skull: Negative

Sinuses/Orbits: Mild mucosal edema paranasal sinuses. Bilateral
cataract extraction.

Other: None

ASPECTS (Alberta Stroke Program Early CT Score)

- Ganglionic level infarction (caudate, lentiform nuclei, internal
capsule, insula, M1-M3 cortex): 7

- Supraganglionic infarction (M4-M6 cortex): 3

Total score (0-10 with 10 being normal): 10
IMPRESSION: 1. No acute abnormality
2. Advanced atrophy and extensive chronic ischemic changes.
3. ASPECTS is 10
4. These results were called by telephone at the time of
interpretation on 04/12/2019 at [DATE] to provider Tiger , who
verbally acknowledged these results.
